# Patient Record
Sex: Male | Born: 1950 | Race: White | Hispanic: No | Marital: Married | Smoking: Never smoker
Health system: Southern US, Community
[De-identification: ages and names within clinical notes are randomized; demographics above are authoritative.]

## PROBLEM LIST (undated history)

## (undated) DIAGNOSIS — F319 Bipolar disorder, unspecified: Secondary | ICD-10-CM

---

## 2015-04-16 ENCOUNTER — Emergency Department (HOSPITAL_COMMUNITY)
Admission: EM | Admit: 2015-04-16 | Discharge: 2015-04-18 | Disposition: A | Discharge status: Home / Self Care | Payer: No Typology Code available for payment source | Attending: Emergency Medicine | Admitting: Emergency Medicine

## 2015-04-16 DIAGNOSIS — S299XXA Unspecified injury of thorax, initial encounter: Secondary | ICD-10-CM | POA: Diagnosis not present

## 2015-04-16 DIAGNOSIS — Y999 Unspecified external cause status: Secondary | ICD-10-CM | POA: Diagnosis not present

## 2015-04-16 DIAGNOSIS — S8992XA Unspecified injury of left lower leg, initial encounter: Secondary | ICD-10-CM | POA: Diagnosis present

## 2015-04-16 DIAGNOSIS — F3164 Bipolar disorder, current episode mixed, severe, with psychotic features: Secondary | ICD-10-CM

## 2015-04-16 DIAGNOSIS — S80212A Abrasion, left knee, initial encounter: Secondary | ICD-10-CM | POA: Diagnosis not present

## 2015-04-16 DIAGNOSIS — Y9389 Activity, other specified: Secondary | ICD-10-CM | POA: Insufficient documentation

## 2015-04-16 DIAGNOSIS — Y92410 Unspecified street and highway as the place of occurrence of the external cause: Secondary | ICD-10-CM | POA: Diagnosis not present

## 2015-04-16 DIAGNOSIS — S3991XA Unspecified injury of abdomen, initial encounter: Secondary | ICD-10-CM | POA: Insufficient documentation

## 2015-04-16 HISTORY — DX: Bipolar disorder, unspecified: F31.9
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
## 2015-04-17 ENCOUNTER — Encounter (HOSPITAL_COMMUNITY): Payer: Self-pay | Admitting: *Deleted

## 2015-04-17 ENCOUNTER — Emergency Department (HOSPITAL_COMMUNITY): Payer: No Typology Code available for payment source

## 2015-04-17 LAB — COMPREHENSIVE METABOLIC PANEL
ALK PHOS: 72 U/L (ref 38–126)
ALT: 38 U/L (ref 17–63)
ANION GAP: 8 (ref 5–15)
AST: 22 U/L (ref 15–41)
Albumin: 3.8 g/dL (ref 3.5–5.0)
BUN: 14 mg/dL (ref 6–20)
CALCIUM: 9.1 mg/dL (ref 8.9–10.3)
CHLORIDE: 104 mmol/L (ref 101–111)
CO2: 27 mmol/L (ref 22–32)
Creatinine, Ser: 1.33 mg/dL — ABNORMAL HIGH (ref 0.61–1.24)
GFR calc non Af Amer: 55 mL/min — ABNORMAL LOW (ref >60)
Glucose, Bld: 91 mg/dL (ref 65–99)
Potassium: 4.7 mmol/L (ref 3.5–5.1)
SODIUM: 139 mmol/L (ref 135–145)
Total Bilirubin: 0.8 mg/dL (ref 0.3–1.2)
Total Protein: 6.4 g/dL — ABNORMAL LOW (ref 6.5–8.1)

## 2015-04-17 LAB — CBC
HCT: 44.9 % (ref 39.0–52.0)
Hemoglobin: 15.4 g/dL (ref 13.0–17.0)
MCH: 29 pg (ref 26.0–34.0)
MCHC: 34.3 g/dL (ref 30.0–36.0)
MCV: 84.6 fL (ref 78.0–100.0)
Platelets: 224 10*3/uL (ref 150–400)
RBC: 5.31 MIL/uL (ref 4.22–5.81)
RDW: 13.6 % (ref 11.5–15.5)
WBC: 10.1 10*3/uL (ref 4.0–10.5)

## 2015-04-17 LAB — RAPID URINE DRUG SCREEN, HOSP PERFORMED
Amphetamines: NOT DETECTED
Barbiturates: NOT DETECTED
Benzodiazepines: NOT DETECTED
COCAINE: NOT DETECTED
OPIATES: NOT DETECTED
TETRAHYDROCANNABINOL: NOT DETECTED

## 2015-04-17 LAB — PROTIME-INR
INR: 1.06 (ref 0.00–1.49)
Prothrombin Time: 14 seconds (ref 11.6–15.2)

## 2015-04-17 LAB — ETHANOL

## 2015-04-17 MED ORDER — IBUPROFEN 400 MG PO TABS: 600.0000 mg | ORAL_TABLET | Freq: Three times a day (TID) | ORAL | Status: DC | PRN

## 2015-04-17 MED ORDER — LORAZEPAM 2 MG/ML IJ SOLN
2.0000 mg | Freq: Once | INTRAMUSCULAR | Status: AC
  Administered 2015-04-17: 2 mg via INTRAMUSCULAR
  Filled 2015-04-17: qty 1

## 2015-04-17 MED ORDER — ONDANSETRON HCL 4 MG PO TABS
4.0000 mg | ORAL_TABLET | Freq: Three times a day (TID) | ORAL | Status: DC | PRN
  Filled 2015-04-17: qty 1

## 2015-04-17 MED ORDER — ACETAMINOPHEN 325 MG PO TABS: 650.0000 mg | ORAL_TABLET | ORAL | Status: DC | PRN

## 2015-04-17 MED ORDER — HALOPERIDOL LACTATE 5 MG/ML IJ SOLN
5.0000 mg | Freq: Once | INTRAMUSCULAR | Status: AC
  Administered 2015-04-17: 5 mg via INTRAMUSCULAR
  Filled 2015-04-17: qty 1

## 2015-04-17 MED ORDER — OLANZAPINE 10 MG PO TBDP
10.0000 mg | ORAL_TABLET | Freq: Once | ORAL | Status: DC
  Filled 2015-04-17: qty 1

## 2015-04-17 MED ORDER — ZOLPIDEM TARTRATE 5 MG PO TABS: 5.0000 mg | ORAL_TABLET | Freq: Every evening | ORAL | Status: DC | PRN

## 2015-04-17 MED ORDER — ZIPRASIDONE MESYLATE 20 MG IM SOLR
20.0000 mg | Freq: Once | INTRAMUSCULAR | Status: AC
  Administered 2015-04-17: 20 mg via INTRAMUSCULAR

## 2015-04-17 NOTE — ED Notes (Signed)
Pt to be assessed by TTS when more alert; pt sleeping soundly

## 2015-04-17 NOTE — ED Notes (Signed)
IVC paperwork served by [employer]. 

## 2015-04-17 NOTE — ED Notes (Signed)
Wilhemina BonitoAdvised [place], [facility], of pt's psych dr in [place].

## 2015-04-17 NOTE — ED Notes (Signed)
Attempted to reach pt's spouse - Shelbie Proctorheresa Terhaar - [phone]- [phone] - wrong number. Left message on [phone] for her to call.
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
## 2015-04-17 NOTE — ED Notes (Signed)
Pt ambulated to desk requesting to speak with wife. Informed pt wife wasn't here. Pt assisted back to room, changed from gown to clothes then attempted to leave AMA through EMS entrance. Pt was stopped by Kathleen LimeEric and Morgan RN, security and [employer] with patient. Pt cooperative and assisted back to room.

## 2015-04-17 NOTE — BH Assessment (Signed)
[facility] Assessment Progress Note   Per [place], RN patient is still very sleepy from the geodon that was administered three hours ago.  TTS will be conducted when patient is able to be oriented and participate.

## 2015-04-17 NOTE — ED Notes (Addendum)
Pt noted to be yelled out loudly x 1. States feels manic.

## 2015-04-17 NOTE — ED Notes (Signed)
Pt yelling/moaning loudly. States is in manic state and is requesting med to help calm him down. Dr Adela LankFloyd aware - order received.

## 2015-04-17 NOTE — ED Notes (Signed)
Pt willing to accept geodon injection for agitation. Pt cooperative at this time, requesting to leave. [employer] and security at bedside. Pt placed in wine colored scrubs. Given something to drink per request. 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

## 2015-04-17 NOTE — ED Notes (Signed)
House coverage contacted for sitter. 
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
## 2015-04-17 NOTE — Progress Notes (Addendum)
[facility] to Dr. Elna BreslowEappen, 500-1, arrival time 12am. Call report at [phone]. [facility] RN Windy KalataYasemia informed.  This Clinical research associatewriter informed patient's wife, Mrs. Yetta BarreJones, that [facility] been [facility] and that he will be arriving tonight. Writer provided the phone [facility] and the [facility], at Mrs. Edward QualiaJone's request. Mrs. Yetta BarreJones thanked Clinical research associatewriter and stated that [facility].  Melbourne Abtsatia Yanett Conkright, LCSWA Disposition staff 04/17/2015 9:02 PM

## 2015-04-17 NOTE — Progress Notes (Signed)
[facility]. RN Windy KalataYasemia informed.  Peter Conrad, LCSWA Disposition staff 04/17/2015 11:02 PM
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
## 2015-04-17 NOTE — ED Notes (Signed)
Pt signed consent form for info to be released to spouse and Dr Tyrell AntonioBetz - [facility] and copy sent to medical records.

## 2015-04-17 NOTE — ED Notes (Signed)
Spoke to [place], pt's wife. [facility]. Informed wife of IVC status. Wife would like to be contacted after TTS can be completed

## 2015-04-17 NOTE — BH Assessment (Signed)
[facility] Assessment Progress Note  Pt's nurse, Becky called, stating pt awake and alert.  Tele assessment to be completed by this clinician.  Casimer LaniusKristen Shuntia Exton, MS, [facility] Therapeutic Triage Specialist [facility]

## 2015-04-17 NOTE — ED Notes (Signed)
Pt talking w/sitter - making inappropriate comments - stating "black people should go back to [place]".
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
## 2015-04-17 NOTE — ED Notes (Signed)
Notified by [facility] 12 am room 500[facility]1 by Dr. Joan MayansEattem. Dr. Adriana Simasook to be notified.

## 2015-04-17 NOTE — ED Notes (Signed)
TTS being performed.  

## 2015-04-17 NOTE — ED Notes (Signed)
Pt aware TTS to be performed then RN will administer meds. Voiced agreement and understanding.

## 2015-04-17 NOTE — ED Notes (Signed)
Patient was given a snack and drink. 

## 2015-04-18 ENCOUNTER — Encounter (HOSPITAL_COMMUNITY): Payer: Self-pay | Admitting: Psychiatry

## 2015-04-18 ENCOUNTER — Inpatient Hospital Stay (HOSPITAL_COMMUNITY)
Admission: AD | Admit: 2015-04-18 | Discharge: 2015-04-27 | DRG: 885 | Disposition: A | Discharge status: Home / Self Care | Payer: Federal, State, Local not specified - Other | Source: Intra-hospital | Attending: Psychiatry | Admitting: Psychiatry

## 2015-04-18 DIAGNOSIS — F39 Unspecified mood [affective] disorder: Secondary | ICD-10-CM | POA: Diagnosis present

## 2015-04-18 DIAGNOSIS — F1120 Opioid dependence, uncomplicated: Secondary | ICD-10-CM | POA: Diagnosis not present

## 2015-04-18 DIAGNOSIS — Z9114 Patient's other noncompliance with medication regimen: Secondary | ICD-10-CM

## 2015-04-18 DIAGNOSIS — F3164 Bipolar disorder, current episode mixed, severe, with psychotic features: Secondary | ICD-10-CM | POA: Diagnosis present

## 2015-04-18 DIAGNOSIS — F319 Bipolar disorder, unspecified: Secondary | ICD-10-CM | POA: Diagnosis present

## 2015-04-18 DIAGNOSIS — IMO0002 Reserved for concepts with insufficient information to code with codable children: Secondary | ICD-10-CM | POA: Diagnosis present

## 2015-04-18 DIAGNOSIS — F1220 Cannabis dependence, uncomplicated: Secondary | ICD-10-CM | POA: Diagnosis not present

## 2015-04-18 DIAGNOSIS — F1099 Alcohol use, unspecified with unspecified alcohol-induced disorder: Secondary | ICD-10-CM | POA: Diagnosis not present

## 2015-04-18 MED ORDER — HALOPERIDOL 5 MG PO TABS: 5.0000 mg | ORAL_TABLET | Freq: Four times a day (QID) | ORAL | Status: DC | PRN

## 2015-04-18 MED ORDER — CHLORDIAZEPOXIDE HCL 25 MG PO CAPS
25.0000 mg | ORAL_CAPSULE | Freq: Three times a day (TID) | ORAL | Status: AC
  Administered 2015-04-19 (×3): 25 mg via ORAL
  Filled 2015-04-18 (×3): qty 1

## 2015-04-18 MED ORDER — BENZTROPINE MESYLATE 0.5 MG PO TABS
0.5000 mg | ORAL_TABLET | Freq: Every day | ORAL | Status: DC
  Administered 2015-04-18 – 2015-04-26 (×9): 0.5 mg via ORAL
  Filled 2015-04-18 (×9): qty 1
  Filled 2015-04-18: qty 7
  Filled 2015-04-18: qty 1

## 2015-04-18 MED ORDER — ARIPIPRAZOLE 5 MG PO TABS
5.0000 mg | ORAL_TABLET | Freq: Every day | ORAL | Status: DC
  Administered 2015-04-18: 5 mg via ORAL
  Filled 2015-04-18 (×3): qty 1

## 2015-04-18 MED ORDER — ADULT MULTIVITAMIN W/MINERALS CH
1.0000 | ORAL_TABLET | Freq: Every day | ORAL | Status: DC
Start: 2015-04-18 — End: 2015-04-27
  Administered 2015-04-18 – 2015-04-27 (×10): 1 via ORAL
  Filled 2015-04-18 (×13): qty 1
  Filled 2015-04-18: qty 7

## 2015-04-18 MED ORDER — ALUM & MAG HYDROXIDE-SIMETH 200-200-20 MG/5ML PO SUSP
30.0000 mL | ORAL | Status: DC | PRN
  Administered 2015-04-20: 30 mL via ORAL
  Filled 2015-04-18: qty 30

## 2015-04-18 MED ORDER — HYDROXYZINE HCL 25 MG PO TABS
25.0000 mg | ORAL_TABLET | Freq: Four times a day (QID) | ORAL | Status: AC | PRN
  Administered 2015-04-18 – 2015-04-20 (×3): 25 mg via ORAL
  Filled 2015-04-18 (×3): qty 1

## 2015-04-18 MED ORDER — CHLORDIAZEPOXIDE HCL 25 MG PO CAPS
25.0000 mg | ORAL_CAPSULE | ORAL | Status: AC
  Administered 2015-04-20 (×2): 25 mg via ORAL
  Filled 2015-04-18 (×2): qty 1

## 2015-04-18 MED ORDER — ONDANSETRON 4 MG PO TBDP: 4.0000 mg | ORAL_TABLET | Freq: Four times a day (QID) | ORAL | Status: AC | PRN

## 2015-04-18 MED ORDER — OLANZAPINE 2.5 MG PO TABS
ORAL_TABLET | ORAL | Status: AC
  Filled 2015-04-18: qty 1

## 2015-04-18 MED ORDER — CHLORDIAZEPOXIDE HCL 25 MG PO CAPS
25.0000 mg | ORAL_CAPSULE | Freq: Every day | ORAL | Status: AC
  Administered 2015-04-21: 25 mg via ORAL
  Filled 2015-04-18: qty 1

## 2015-04-18 MED ORDER — LOPERAMIDE HCL 2 MG PO CAPS: 2.0000 mg | ORAL_CAPSULE | ORAL | Status: AC | PRN

## 2015-04-18 MED ORDER — BENZTROPINE MESYLATE 1 MG PO TABS: 1.0000 mg | ORAL_TABLET | Freq: Four times a day (QID) | ORAL | Status: DC | PRN

## 2015-04-18 MED ORDER — THIAMINE HCL 100 MG/ML IJ SOLN
100.0000 mg | Freq: Once | INTRAMUSCULAR | Status: DC
  Filled 2015-04-18: qty 2

## 2015-04-18 MED ORDER — MAGNESIUM HYDROXIDE 400 MG/5ML PO SUSP: 30.0000 mL | Freq: Every day | ORAL | Status: DC | PRN

## 2015-04-18 MED ORDER — OLANZAPINE 5 MG PO TBDP
ORAL_TABLET | ORAL | Status: AC
  Filled 2015-04-18: qty 1

## 2015-04-18 MED ORDER — ZIPRASIDONE HCL 20 MG PO CAPS
20.0000 mg | ORAL_CAPSULE | Freq: Four times a day (QID) | ORAL | Status: DC | PRN
  Administered 2015-04-18 – 2015-04-20 (×2): 20 mg via ORAL
  Filled 2015-04-18 (×2): qty 1

## 2015-04-18 MED ORDER — DIVALPROEX SODIUM ER 500 MG PO TB24
500.0000 mg | ORAL_TABLET | Freq: Every day | ORAL | Status: DC
  Administered 2015-04-18: 500 mg via ORAL
  Filled 2015-04-18 (×3): qty 1

## 2015-04-18 MED ORDER — ACETAMINOPHEN 325 MG PO TABS
650.0000 mg | ORAL_TABLET | Freq: Four times a day (QID) | ORAL | Status: DC | PRN
  Administered 2015-04-19 – 2015-04-26 (×5): 650 mg via ORAL
  Filled 2015-04-18 (×5): qty 2

## 2015-04-18 MED ORDER — ZIPRASIDONE MESYLATE 20 MG IM SOLR
20.0000 mg | Freq: Four times a day (QID) | INTRAMUSCULAR | Status: DC | PRN
Start: 2015-04-18 — End: 2015-04-27
  Filled 2015-04-18: qty 20

## 2015-04-18 MED ORDER — VITAMIN B-1 100 MG PO TABS
100.0000 mg | ORAL_TABLET | Freq: Every day | ORAL | Status: DC
  Administered 2015-04-19 – 2015-04-27 (×9): 100 mg via ORAL
  Filled 2015-04-18 (×11): qty 1

## 2015-04-18 MED ORDER — CHLORDIAZEPOXIDE HCL 25 MG PO CAPS
25.0000 mg | ORAL_CAPSULE | Freq: Four times a day (QID) | ORAL | Status: DC | PRN
  Administered 2015-04-19: 25 mg via ORAL
  Filled 2015-04-18: qty 1

## 2015-04-18 MED ORDER — OLANZAPINE 5 MG PO TABS
5.0000 mg | ORAL_TABLET | Freq: Four times a day (QID) | ORAL | Status: DC | PRN
Start: 2015-04-18 — End: 2015-04-18
  Administered 2015-04-18: 5 mg via ORAL

## 2015-04-18 MED ORDER — CHLORDIAZEPOXIDE HCL 25 MG PO CAPS
25.0000 mg | ORAL_CAPSULE | Freq: Four times a day (QID) | ORAL | Status: AC
  Administered 2015-04-18 (×3): 25 mg via ORAL
  Filled 2015-04-18 (×3): qty 1
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
## 2015-04-18 NOTE — Progress Notes (Signed)
1:1 note:  Pt actively hallucinating; auditory and visual hallucinations reported by assigned MHT. Increase aggiation/aggression noted MHT reports pt verbally aggressive "I'm going to hit you" and making hand gestures. Pt observed on the unit with verbal aggression towards peers. Difficult to redirect. No insight in regards to behavior. PRN medication given for agitation. 1:1 remains for safety.

## 2015-04-18 NOTE — Progress Notes (Signed)
1:1 note:  Pt presents sitting in his assigned bed eating his dinner. Pt voices no complaints at this time. Behavior calm at this time. Pt presents with increased confusion, delusional. Pt believes he is [employer]. MHT at bedside. Flight of ideas noted. Remains on 1:1 for safety.

## 2015-04-18 NOTE — [facility] Group Notes (Signed)
[facility] LCSW Group Therapy  04/18/2015 1:20 PM  Type of Therapy: Group Therapy  Participation Level:Invited. Chose not to attend.  Summary of Progress/Problems: Onalee HuaDavid from [employer] was here to tell his story of recovery and play his guitar.  Vito BackersLynn B. Beverely PaceBryant 04/18/2015 1:20 PM

## 2015-04-18 NOTE — Progress Notes (Signed)
Adult Psychoeducational Group Note  Date:  04/18/2015 Time:  9:04 PM  Group Topic/Focus:  Wrap-Up Group:   The focus of this group is to help patients review their daily goal of treatment and discuss progress on daily workbooks.  Participation Level:  Did Not Attend  Participation Quality:  Did not attend  Affect:  Did not attend  Cognitive:  Did not attend  Insight: None  Engagement in Group:  Did not attend  Modes of Intervention:  Did not attend  Additional Comments:  Patient did not attend wrap up group tonight.   Lilit Cinelli L Raylyn Speckman 04/18/2015, 9:04 PM

## 2015-04-18 NOTE — ED Notes (Signed)
MD at the bedside  
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
## 2015-04-18 NOTE — ED Notes (Signed)
Attempted Report x1.   

## 2015-04-18 NOTE — Tx Team (Signed)
Initial Interdisciplinary Treatment Plan   PATIENT STRESSORS: Medication change or noncompliance Substance abuse   PATIENT STRENGTHS: Ability for insight   PROBLEM LIST: Problem List/Patient Goals Date to be addressed Date deferred Reason deferred Estimated date of resolution  Psychosis 04/18/2015     Medication non compliance 04/18/2015     "Not sleeping good" 04/18/2015     "I got tired of them, sometimes make me feel down" 04/18/2015     "All my life with mental health."  04/18/2015                              DISCHARGE CRITERIA:  Ability to meet basic life and health needs Improved stabilization in mood, thinking, and/or behavior  PRELIMINARY DISCHARGE PLAN: Return to previous living arrangement  PATIENT/FAMIILY INVOLVEMENT: This treatment plan has been presented to and reviewed with the patient, Peter Conrad, Peter Conrad 04/18/2015, 2:56 PM
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
## 2015-04-18 NOTE — Progress Notes (Signed)
Pt is weak and unsteady on his feet   He is confused and has difficulty processing information   Pt is on a 1:1 for safety  Safety maintained

## 2015-04-18 NOTE — Progress Notes (Signed)
Patient lacks capacity to participate in disposition planning.   Peter LongsSaramma Aeris Conrad ,MD [facility]
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
## 2015-04-19 LAB — URINALYSIS W MICROSCOPIC (NOT AT ARMC)
GLUCOSE, UA: NEGATIVE mg/dL
Ketones, ur: NEGATIVE mg/dL
Leukocytes, UA: NEGATIVE
NITRITE: NEGATIVE
PROTEIN: NEGATIVE mg/dL
SPECIFIC GRAVITY, URINE: 1.028 (ref 1.005–1.030)
UROBILINOGEN UA: 0.2 mg/dL (ref 0.0–1.0)
pH: 5.5 (ref 5.0–8.0)

## 2015-04-19 LAB — LIPID PANEL
CHOL/HDL RATIO: 3 ratio
Cholesterol: 190 mg/dL (ref 0–200)
HDL: 63 mg/dL (ref >40)
LDL Cholesterol: 103 mg/dL — ABNORMAL HIGH (ref 0–99)
TRIGLYCERIDES: 121 mg/dL (ref <150)
VLDL: 24 mg/dL (ref 0–40)

## 2015-04-19 LAB — TSH: TSH: 0.881 u[IU]/mL (ref 0.350–4.500)

## 2015-04-19 MED ORDER — DIVALPROEX SODIUM ER 250 MG PO TB24
750.0000 mg | ORAL_TABLET | Freq: Every day | ORAL | Status: DC
  Administered 2015-04-19 – 2015-04-26 (×8): 750 mg via ORAL
  Filled 2015-04-19 (×2): qty 3
  Filled 2015-04-19: qty 21
  Filled 2015-04-19 (×7): qty 3

## 2015-04-19 MED ORDER — ARIPIPRAZOLE 10 MG PO TABS
10.0000 mg | ORAL_TABLET | Freq: Every day | ORAL | Status: DC
  Administered 2015-04-19: 10 mg via ORAL
  Filled 2015-04-19 (×2): qty 1

## 2015-04-19 NOTE — [facility] Group Notes (Signed)
[facility] Group Notes:  (Nursing/MHT/Case Management/Adjunct)  Date:  04/19/2015  Time: 0930 Type of Therapy:  Nurse Education  Participation Level:  Did Not Attend  Summary of Progress/Problems:  Peter Conrad 04/19/2015, 1:25 PM

## 2015-04-19 NOTE — Progress Notes (Signed)
Pt remains stiff and has an unsteady gait he is weak and tremulous   Pt is on a 1:1 for safety   Safety maintained
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
## 2015-04-19 NOTE — Plan of Care (Signed)
Problem: Ineffective individual coping Goal: STG: Patient will remain free from self harm Outcome: Progressing Pt has remained free from self harm     
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
## 2015-04-19 NOTE — [facility] Group Notes (Signed)
[facility] LCSW Group Therapy  04/19/2015 1:15 pm  Type of Therapy: Process Group Therapy  Participation Level:  Active  Participation Quality:  Appropriate  Affect:  Flat  Cognitive:  Oriented  Insight:  Improving  Engagement in Group:  Limited  Engagement in Therapy:  Limited  Modes of Intervention:  Activity, Clarification, Education, Problem-solving and Support  Summary of Progress/Problems: Today's group addressed the issue of overcoming obstacles.  Patients were asked to identify their biggest obstacle post d/c that stands in the way of their on-going success, and then problem solve as to how to manage this.  Sat quietly throughout.  Left at one point, but returned.  At the end, talked about how he enjoyed hearing other people's stories.  "It's good to know I am not the only one going through turmoil."  Daryel Geraldorth, Jacobi Nile B 04/19/2015   1:42 PM

## 2015-04-19 NOTE — Progress Notes (Signed)
1:1 note:  Pt completed self inventory form with assigned MHT assist.Per patient self inventory form pt reports he slept good last night with the use of sleep medication. He reports a good appetite, normal energy level, good concentration. He rates depression 7/10, hopelessness 0/10, anxiety 0/10- all on 0-10 scale, 10 being the worse. Pt denies SI/HI. Delusional. Pt denies physical pain. Pt reports his goal for the day is "get well" and he will "talk to people get out and mingle" to help meet his goal today. "you got a fine staff here today" Pt OOB active in the milieu.  Confusion still noted. Pt voices no complaints. Remains on 1:1 for safety. Will continue to monitor.

## 2015-04-19 NOTE — Progress Notes (Deleted)
Patient ID: Lowanda FosterBeverly Csaszar, male   DOB: Jul 18, 1950, 64 y.o.   MRN: [id number] PER STATE REGULATIONS 482.30  THIS CHART WAS REVIEWED FOR MEDICAL NECESSITY WITH RESPECT TO THE PATIENT'S ADMISSION/DURATION OF STAY.  NEXT REVIEW DATE:04/22/15  Loura HaltBARBARA Navreet Bolda, RN, BSN CASE MANAGER

## 2015-04-19 NOTE — Progress Notes (Signed)
1:1 note:  Pt pleasant on approach. Denies s/s of withdrawal at this time. Compliant with medication regimen. Pt disoriented to place at this time. Easily redirected. OOB throughout the shift. Pt voices no complaints at this time. Assigned MHT present. Gait slow, but steady. Will continue to monitor on 1:1 for safety

## 2015-04-19 NOTE — Progress Notes (Signed)
Pt woke up briefly and took his medications   He seemed a little clearer   He denied withdrawal symptoms   He continues to be weak and shakey and needs assistance to sit up in bed to take his medications    Pt is on a 1:1 for safety   Safety maintained

## 2015-04-19 NOTE — Progress Notes (Signed)
Pt did not attend karaoke this evening.  

## 2015-04-19 NOTE — Progress Notes (Signed)
1:1 note 0800:  Pt presents sitting in his bed eating his breakfast. Behavior calm. Pleasant on approach. Increase confusion noted. Delusional. Pt denies SI/HI. Pt reports he had a good night. No complaints at this time. Assigned MHT at bedside. Will continue to monitor on 1:1 for safety.
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
## 2015-04-20 LAB — PROLACTIN: Prolactin: 14.9 ng/mL (ref 4.0–15.2)

## 2015-04-20 LAB — HEMOGLOBIN A1C
Hgb A1c MFr Bld: 5.6 % (ref 4.8–5.6)
Mean Plasma Glucose: 114 mg/dL

## 2015-04-20 MED ORDER — TRAZODONE HCL 100 MG PO TABS
100.0000 mg | ORAL_TABLET | Freq: Every day | ORAL | Status: DC
  Administered 2015-04-20 – 2015-04-22 (×3): 100 mg via ORAL
  Filled 2015-04-20 (×5): qty 1

## 2015-04-20 MED ORDER — ZIPRASIDONE MESYLATE 20 MG IM SOLR
20.0000 mg | Freq: Once | INTRAMUSCULAR | Status: DC
  Filled 2015-04-20: qty 20

## 2015-04-20 MED ORDER — ARIPIPRAZOLE 15 MG PO TABS
15.0000 mg | ORAL_TABLET | Freq: Every day | ORAL | Status: DC
  Administered 2015-04-20 – 2015-04-22 (×3): 15 mg via ORAL
  Filled 2015-04-20 (×5): qty 1

## 2015-04-20 MED ORDER — DIPHENHYDRAMINE HCL 50 MG/ML IJ SOLN
INTRAMUSCULAR | Status: AC
  Filled 2015-04-20: qty 1

## 2015-04-20 MED ORDER — DIPHENHYDRAMINE HCL 50 MG/ML IJ SOLN
50.0000 mg | Freq: Once | INTRAMUSCULAR | Status: DC
  Filled 2015-04-20: qty 1
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
## 2015-04-20 NOTE — Progress Notes (Signed)
D:Pt is in the dayroom watching TV. Pt continues to be tangential with speech. A:Offered support and 1:1 observation. R:Safety maintained on the unit.
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
## 2015-04-20 NOTE — Progress Notes (Signed)
  D: When asked about his day pt stated, "going very well today. Medications are working".  Pt informed the writer that he was "hoping to get to visit and have fun with his sisters and brothers". Writer asked pt if he meant his peers or was he referring to his real sisters and brothers. Pt stated he didn't have any peers. Writer informed pt that visitation was over at 2000 and that maybe he could have his siblings come tomorrow. Stated, "I was hoping to go to karaoke to have a good time with everyone else". Informed the pt that on a 1:1 he would not be able to go to any activities off the unit. Pt asked why he was placed on a 1:1. Writer explained that it was due to his behavior. Pt stated he's been good all day. Pt encouraged writer to speak with his dr in the am in hopes of discontinuing the 1:1. Pt voiced no other questions or concerns at this time.   A:  Support and encouragement was offered. Continue to monitor 1:1 for safety.  R: Pt remains safe.
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
## 2015-04-20 NOTE — Progress Notes (Signed)
B[facility] Post 1:1 Observation Documentation  For the first (8) hours following discontinuation of 1:1 precautions, a progress note entry by nursing staff should be documented at least every 2 hours, reflecting the patient's behavior, condition, mood, and conversation.  Use the progress notes for additional entries.  Time 1:1 discontinued:  2238  Patient's Behavior:  Laying in bed resting with eyes closed.   Patient's Condition:  No distress noted  Patient's Conversation:  none  Doristine JohnsBrooks, Shalice Woodring Laverne 04/20/2015, 5:46 AM

## 2015-04-20 NOTE — Progress Notes (Signed)
[facility] Post 1:1 Observation Documentation  For the first (8) hours following discontinuation of 1:1 precautions, a progress note entry by nursing staff should be documented at least every 2 hours, reflecting the patient's behavior, condition, mood, and conversation.  Use the progress notes for additional entries.  Time 1:1 discontinued:  2238  Patient's Behavior:  Pacing the hall. Knocked on peer's door to get her to come to his room.   Patient's Condition:  Alert  Patient's Conversation:  Pt was informed that he couldn't enter another pt's room. Stated he wasn't going to go into the room, but was knocking. Pt complied and was given prn geodon.   Peter Conrad, Peter Conrad 04/20/2015, 5:38 AM

## 2015-04-20 NOTE — Plan of Care (Signed)
Problem: Ineffective individual coping Goal: STG: Pt will be able to identify effective and ineffective STG: Pt will be able to identify effective and ineffective coping patterns  Outcome: Progressing Pt is manic and unable to process at this time.

## 2015-04-20 NOTE — Progress Notes (Signed)
B[facility] Post 1:1 Observation Documentation  For the first (8) hours following discontinuation of 1:1 precautions, a progress note entry by nursing staff should be documented at least every 2 hours, reflecting the patient's behavior, condition, mood, and conversation.  Use the progress notes for additional entries.  Time 1:1 discontinued:  2238  Patient's Behavior:  Pt laying in bed resting with eyes closed.  Patient's Condition:  No distress noted   Patient's Conversation:  none  Doristine JohnsBrooks, Madix Blowe Laverne 04/20/2015, 6:11 AM

## 2015-04-20 NOTE — B[facility] Group Notes (Signed)
[facility] LCSW Group Therapy  04/20/2015  1:05 PM  Type of Therapy:  Group therapy  Participation Level:  Active  Participation Quality:  Attentive  Affect:  Flat  Cognitive:  Oriented  Insight:  Limited  Engagement in Therapy:  Limited  Modes of Intervention:  Discussion, Socialization  Summary of Progress/Problems:  Chaplain was here to lead a group on themes of hope and courage. "Courage is the ability to stand up against all odds.  Whenever I think of my brother, I think of what I lost."  Began to cry.  Talked about the process of settling brothers estate. A bit tangential.  "I'm finding courage to live day to day, to get stronger every day."  Drowzy. Limited insight. Peter Conrad, Peter Conrad 04/20/2015 1:42 PM
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
## 2015-04-20 NOTE — Progress Notes (Signed)
[facility] Post 1:1 Observation Documentation  For the first (8) hours following discontinuation of 1:1 precautions, a progress note entry by nursing staff should be documented at least every 2 hours, reflecting the patient's behavior, condition, mood, and conversation.  Use the progress notes for additional entries.  Time 1:1 discontinued:  2238  Patient's Behavior:  Bed resting with eyes closed.     Patient's Condition:  No distress noted.   Patient's Conversation:  none  Doristine JohnsBrooks, Bibiana Gillean Laverne 04/20/2015, 5:53 AM
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
## 2015-04-21 DIAGNOSIS — F3164 Bipolar disorder, current episode mixed, severe, with psychotic features: Principal | ICD-10-CM

## 2015-04-21 MED ORDER — CHLORDIAZEPOXIDE HCL 25 MG PO CAPS
50.0000 mg | ORAL_CAPSULE | Freq: Three times a day (TID) | ORAL | Status: DC | PRN
  Administered 2015-04-22 – 2015-04-26 (×2): 50 mg via ORAL
  Filled 2015-04-21 (×2): qty 2

## 2015-04-21 MED ORDER — HYDROCORTISONE 1 % EX CREA
TOPICAL_CREAM | Freq: Two times a day (BID) | CUTANEOUS | Status: DC
  Administered 2015-04-21 – 2015-04-22 (×2): via TOPICAL
  Administered 2015-04-23: 1 via TOPICAL
  Administered 2015-04-23 – 2015-04-24 (×2): via TOPICAL
  Administered 2015-04-27: 1 via TOPICAL
  Filled 2015-04-21: qty 28

## 2015-04-21 NOTE — Progress Notes (Addendum)
1:1 Note   Patient observed in dayroom watching television. Patient calm and cooperative. Patient did engage in conversation with this Clinical research associatewriter states his name is "Horticulturist, commercialBill Conrad". Patient continued to communicate he had a good day and he "fells things are getting better since he is on his medication." " My head is clearer now." Patient maintained on constant supervision for safety. 1:1 MHT at arms length to patient.  Patient with no concerns reported. Encouraged patient to express thoughts and feelings. Monitoring continues.

## 2015-04-21 NOTE — Progress Notes (Signed)
1:1 Note: Patient maintained on constant supervision for safety.  Patient in the dayroom having lunch.  No issues reported or observed.  Offered no complaint.  Patient affect and mood is calm.

## 2015-04-21 NOTE — Progress Notes (Signed)
Observation note: Patient up and in the dayroom.  Mood and affect is calm and pleasant.  Patient continues on constant supervision for safety.  Patient in the dayroom watching TV.  No behavioral issues noted or reported.
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
## 2015-04-21 NOTE — Progress Notes (Signed)
1:1 Note: Patient maintained on constant observation for safety. Patient rates depression, hopelessness, and anxiety at 0/10.  States goal for today is "getting home."  Support and encouragement offered as needed.  Attended group therapy and participated.

## 2015-04-21 NOTE — Progress Notes (Signed)
   D:Pt laying in bed resting with eyes closed. Respirations even and unlabored. No distress noted. A: Monitor 1:1 for safety. R: Pt remains safe.   

## 2015-04-21 NOTE — Progress Notes (Signed)
   D:  Pt informed the writer that he "wants to be out of her by Sunday". Stated, "I want to go to  homecoming and [employer] service. There's something special where the dead rises".  Pt asked the writer if she was aware of the even occuring this Sunday. Pt informed the writer that she wasn't aware. Pt voiced no other questions or concerns.   A:  Support and encouragement was offered. 15 min checks continued for safety.  R: Pt remains safe.

## 2015-04-22 MED ORDER — NAPHAZOLINE-PHENIRAMINE 0.025-0.3 % OP SOLN
1.0000 [drp] | Freq: Four times a day (QID) | OPHTHALMIC | Status: DC | PRN
  Administered 2015-04-22: 1 [drp] via OPHTHALMIC
  Filled 2015-04-22: qty 15

## 2015-04-22 MED ORDER — NAPHAZOLINE HCL 0.1 % OP SOLN: 1.0000 [drp] | Freq: Four times a day (QID) | OPHTHALMIC | Status: DC | PRN

## 2015-04-22 NOTE — Progress Notes (Signed)
[facility] Post 1:1 Observation Documentation  For the first (8) hours following discontinuation of 1:1 precautions, a progress note entry by nursing staff should be documented at least every 2 hours, reflecting the patient's behavior, condition, mood, and conversation.  Use the progress notes for additional entries.  Time 1:1 discontinued:  1220  Patient's Behavior:calm and cooperative    Patient's Condition:  Pt is steady on his feet  Patient's Conversation:  Mild confusion and tangential  Andrena Mewsuttall, Lafreda Casebeer J 04/22/2015, 9:50 PM

## 2015-04-22 NOTE — Progress Notes (Signed)
Pt agitated and anxious at this time. Pt paranoid and became verbally aggressive. Pt given prn librium for anxiety. Pt refused to attend supper in the cafeteria this evening. Pt given supper in the dayroom.

## 2015-04-22 NOTE — Progress Notes (Signed)
1:1 Note  Patient observed in room sitting on bed reading a book. Patient maintained with continuous supervision for safety. 1:1 MHT at arms length. Monitoring continues.

## 2015-04-22 NOTE — Progress Notes (Signed)
[facility] Post 1:1 Observation Documentation  For the first (8) hours following discontinuation of 1:1 precautions, a progress note entry by nursing staff should be documented at least every 2 hours, reflecting the patient's behavior, condition, mood, and conversation.  Use the progress notes for additional entries.  Time 1:1 discontinued:  1220  Patient's Behavior:  Calm and cooperative  Patient's Condition:  Safety maintained. Pt sitting in dayroom eating lunch.   Patient's Conversation:  Speech tangentialCliffton Conrad.  Peter Conrad 04/22/2015, 12:21 PM

## 2015-04-22 NOTE — [facility] Group Notes (Signed)
[facility] Group Notes:  (Clinical Social Work)  04/22/2015  [facility] Group Notes:  (Clinical Social Work)  04/22/2015  11:00AM-12:00PM  Summary of Progress/Problems:  The main focus of today's process group was to listen to a variety of genres of music and to identify that different types of music provoke different responses.  The patient then was able to identify personally what was soothing for them, as well as energizing.  The patient expressed understanding of concepts, as well as knowledge of how each type of music affected him and how this can be used at home as a wellness/recovery tool.  He cried during several songs, got up and danced and generally really was interactive with the music.  Type of Therapy:  Music Therapy   Participation Level:  Active  Participation Quality:  Attentive and Sharing  Affect:  Blunted and Tearful  Cognitive:  Oriented  Insight:  Engaged  Engagement in Therapy:  Engaged  Modes of Intervention:   Activity, Exploration  Ambrose MantleMareida Grossman-Orr, LCSW 04/22/2015
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
## 2015-04-22 NOTE — Progress Notes (Signed)
[facility] Post 1:1 Observation Documentation  For the first (8) hours following discontinuation of 1:1 precautions, a progress note entry by nursing staff should be documented at least every 2 hours, reflecting the patient's behavior, condition, mood, and conversation.  Use the progress notes for additional entries.  Time 1:1 discontinued:  1220  Patient's Behavior:  Pt observed lying in bed asleep. Pt do not appear to be in distress. Pt resp are even and unlabored.  Patient's Condition:  Pt safety maintained. 15 minute checks performed for safety.  Patient's Conversation:  Peter Ehlersn/a  Peter Conrad L 04/22/2015, 4:25 PM

## 2015-04-22 NOTE — Progress Notes (Signed)
Observation note: Pt presents calm and cooperative this morning while taking scheduled medications. Pt stated that he do not want to take any meds that will cause him to be drowsy. Writer reviewed meds with pt and pt verbalized understanding and agreed to take meds. Pt denies AVH and stated he last endorsed AVH two days ago. Pt speech tangential and pt thoughts are disorganized occasionally. Pt denies suicidal thoughts. Pt reported that he slept well last night. Pt remains on 1:1 observation for safety. Pt safety maintained.

## 2015-04-22 NOTE — Progress Notes (Signed)
1:1 Note  Patient observed in bed sleeping with 1:1 MHT at arms length. No distress noted. Patient maintained on constant supervision for safety. Monitoring continues. 

## 2015-04-22 NOTE — Plan of Care (Signed)
Problem: Alteration in mood & ability to function due to Goal: LTG-Pt verbalizes understanding of importance of med regimen (Patient verbalizes understanding of importance of medication regimen and need to continue outpatient care and support groups)  Outcome: Progressing Patient stated "I feel I am better my head and thoughts are clearer, I believe the medication is working. Patient took medications without difficulty. Patient educated on medication reinforcement need questionable how much patient understood. Monitoring continues.

## 2015-04-22 NOTE — Progress Notes (Signed)
Adult Psychoeducational Group Note  Date:  04/22/2015 Time:  8:34 PM  Group Topic/Focus:  Wrap-Up Group:   The focus of this group is to help patients review their daily goal of treatment and discuss progress on daily workbooks.  Participation Level:  Active  Participation Quality:  Appropriate  Affect:  Appropriate  Cognitive:  Appropriate  Insight: Appropriate  Engagement in Group:  Engaged  Modes of Intervention:  Discussion  Additional Comments: The patient expressed that he attended group.The patient also said that he had a good day and rates it a 9.  [name], Duriel Deery Lee 04/22/2015, 8:34 PM

## 2015-04-22 NOTE — Progress Notes (Signed)
[facility] Post 1:1 Observation Documentation  For the first (8) hours following discontinuation of 1:1 precautions, a progress note entry by nursing staff should be documented at least every 2 hours, reflecting the patient's behavior, condition, mood, and conversation.  Use the progress notes for additional entries.  Time 1:1 discontinued:  1220  Patient's Behavior:  Pt calm and cooperative at this time. Pt observed sitting in dayroom, watching television.  Patient's Condition:  Pt safety maintained at this time. 15 minute checks performed for safety.   Patient's Conversation:  Speech tangentialCliffton Asters.  Satvik Parco L 04/22/2015, 6:31 PM

## 2015-04-22 NOTE — Progress Notes (Signed)
[facility] Post 1:1 Observation Documentation  For the first (8) hours following discontinuation of 1:1 precautions, a progress note entry by nursing staff should be documented at least every 2 hours, reflecting the patient's behavior, condition, mood, and conversation.  Use the progress notes for additional entries.  Time 1:1 discontinued:  1220  Patient's Behavior:  Pt calm and cooperative.  Patient's Condition:  Pt safety maintained at this time.   Patient's Conversation:  Tangential speech.   Lynzi Meulemans L 04/22/2015, 3:02 PM

## 2015-04-22 NOTE — Progress Notes (Signed)
D   Pt was observed in the dayroom watching tv and talking on the phone   He is more steady on his feet but remains tangential and mildly confused when talking to him   He did not make any delusional statements   His interactions with others is minimal but appropriate   He denies any withdrawal symptoms A   Verbal support given   Medications administered and effectiveness monitored    Continue to evaluate confusion and withdrawal symptoms    Q 15 min checks R    Pt safe at present and receptive to verbal support

## 2015-04-23 LAB — VALPROIC ACID LEVEL: Valproic Acid Lvl: 51 ug/mL (ref 50.0–100.0)

## 2015-04-23 MED ORDER — ARIPIPRAZOLE 10 MG PO TABS
20.0000 mg | ORAL_TABLET | Freq: Every day | ORAL | Status: DC
  Administered 2015-04-23 – 2015-04-25 (×3): 20 mg via ORAL
  Filled 2015-04-23 (×5): qty 2

## 2015-04-23 MED ORDER — TRAZODONE HCL 50 MG PO TABS
ORAL_TABLET | ORAL | Status: AC
  Filled 2015-04-23: qty 1

## 2015-04-23 MED ORDER — TRAZODONE HCL 100 MG PO TABS
125.0000 mg | ORAL_TABLET | Freq: Every day | ORAL | Status: DC
  Administered 2015-04-23: 125 mg via ORAL
  Filled 2015-04-23 (×2): qty 1
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
## 2015-04-23 NOTE — [facility] Group Notes (Signed)
[facility] Group Notes:  (Counselor/Nursing/MHT/Case Management/Adjunct)  04/23/2015 1:15PM  Type of Therapy:  Group Therapy  Participation Level:  Active  Participation Quality:  Appropriate  Affect:  Flat  Cognitive:  Oriented  Insight:  Improving  Engagement in Group:  Limited  Engagement in Therapy:  Limited  Modes of Intervention:  Discussion, Exploration and Socialization  Summary of Progress/Problems: The topic for group was balance in life.  Pt participated in the discussion about when their life was in balance and out of balance and how this feels.  Pt discussed ways to get back in balance and short term goals they can work on to get where they want to be. Stayed the entire time.  Was generally engaged.  Nodded off a few times, but not for long.  Talked about the death of his brother again.  "Although it happened in '13, it is still affecting me.  I haven't been the same since."  Talked about his hopes of getting through this "and getting back on the golf course again.  It's good being with friends, and forgetting about everything else for awhile."   Ida Rogueorth, Haeli Gerlich B 04/23/2015 3:15 PM

## 2015-04-23 NOTE — [facility] Group Notes (Signed)
[facility]   04/23/2015 10:47 AM  Participation Quality:  Engaged  Mood/Affect:  Flat  Depression Rating:    Anxiety Rating:    Thoughts of Suicide:  No Will you contract for safety?   NA  Current AVH:  No  Plan for Discharge/Comments:  Peter Conrad is in a good mood today.  States that he is happy with the change in medication.  Despite c/o sedation last week, states that he realizes he was "wide open" before coming in and it created problems.  Looking forward to getting back to playing golf 3 times a week with his buddies. When asked about his clothes, states he deos not need clothes-likes hospital issued garb.  "it's comfortable, it breathes."  Transportation Means:   Supports:  Peter Conrad, Peter Conrad

## 2015-04-23 NOTE — Plan of Care (Signed)
Problem: Alteration in mood & ability to function due to Goal: LTG-Pt reports reduction in suicidal thoughts (Patient reports reduction in suicidal thoughts and is able to verbalize a safety plan for whenever patient is feeling suicidal)  Outcome: Progressing Pt denies suicidal ideation at present Goal: LTG-Patient demonstrates decreased signs of withdrawal (Patient demonstrates decreased signs of withdrawal to the point the patient is safe to return home and continue treatment in an outpatient setting)  Outcome: Progressing Pt denies withdrawal symptoms and his ciwas have been low Goal: STG-Patient will attend groups Outcome: Progressing Pt attends most of his groups Goal: STG-Patient will comply with prescribed medication regimen (Patient will comply with prescribed medication regimen)  Pt takes his medications regularly

## 2015-04-23 NOTE — Progress Notes (Signed)
DAR NOTE: Patient presents with calm mood and affect.  Denies pain, auditory and visual hallucinations.  Rates depression , hopelessness, and anxiety at 0/10.  Maintained on routine safety checks.  Medications given as prescribed.  Support and encouragement offered as needed.  Attended group and participated.  States goal for today is "getting well and contacting family."  Patient observed socializing with peers in the dayroom.

## 2015-04-24 MED ORDER — INFLUENZA VAC SPLIT QUAD 0.5 ML IM SUSY
0.5000 mL | PREFILLED_SYRINGE | INTRAMUSCULAR | Status: AC
  Administered 2015-04-25: 0.5 mL via INTRAMUSCULAR
  Filled 2015-04-24: qty 0.5

## 2015-04-24 MED ORDER — ARIPIPRAZOLE ER 400 MG IM SUSR
400.0000 mg | INTRAMUSCULAR | Status: DC
  Filled 2015-04-24: qty 400

## 2015-04-24 MED ORDER — ARIPIPRAZOLE ER 400 MG IM SUSR
400.0000 mg | INTRAMUSCULAR | Status: DC
  Administered 2015-04-25: 400 mg via INTRAMUSCULAR

## 2015-04-24 MED ORDER — HYDROXYZINE HCL 50 MG PO TABS
50.0000 mg | ORAL_TABLET | Freq: Every evening | ORAL | Status: DC | PRN
  Administered 2015-04-26: 50 mg via ORAL
  Filled 2015-04-24: qty 1

## 2015-04-24 MED ORDER — TRAZODONE HCL 150 MG PO TABS
150.0000 mg | ORAL_TABLET | Freq: Every day | ORAL | Status: DC
  Administered 2015-04-24 – 2015-04-26 (×3): 150 mg via ORAL
  Filled 2015-04-24 (×4): qty 1
  Filled 2015-04-24: qty 7

## 2015-04-24 NOTE — Plan of Care (Signed)
Problem: Alteration in mood & ability to function due to Goal: LTG-Patient demonstrates decreased signs of withdrawal (Patient demonstrates decreased signs of withdrawal to the point the patient is safe to return home and continue treatment in an outpatient setting)  Outcome: Progressing He denies any withdrawal symptoms and CIWA's have been negative.
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
## 2015-04-24 NOTE — [facility] Group Notes (Signed)
[facility] LCSW Group Therapy  04/24/2015 , 1:13 PM   Type of Therapy:  Group Therapy  Participation Level:  Active  Participation Quality:  Attentive  Affect:  Appropriate  Cognitive:  Alert  Insight:  Improving  Engagement in Therapy:  Engaged  Modes of Intervention:  Discussion, Exploration and Socialization  Summary of Progress/Problems: Today's group focused on the term Diagnosis.  Participants were asked to define the term, and then pronounce whether it is a negative, positive or neutral term.  Stayed the entire time.  Spent mnost of the session with eyes closed.  When questioned directly, would need to have question repeated, but was then able to regroup and give an answer germane to the topic.  Daryel Geraldorth, Corrin Sieling B 04/24/2015 , 1:13 PM
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
## 2015-04-24 NOTE — [facility] Group Notes (Signed)
Adult Psychoeducational Group Note  Date:  04/24/2015 Time:  9:10 PM  Group Topic/Focus:  Wrap-Up Group:   The focus of this group is to help patients review their daily goal of treatment and discuss progress on daily workbooks.  Participation Level:  Active  Participation Quality:  Attentive  Affect:  Appropriate  Cognitive:  Appropriate  Insight: Good  Engagement in Group:  Engaged  Modes of Intervention:  Discussion  Additional Comments:  Pt. stated his day was good.  He also expressed it was the best day he has had in awhile.  His goal was to "get some napping in" because he didn't sleep well last night.  He also expressed he wanted to get to bed early and he is expecting to discharge on Thursday.  Caroll RancherLindsay, Yitta Gongaware A 04/24/2015, 9:10 PM

## 2015-04-24 NOTE — [facility] Group Notes (Signed)
[facility] Group Notes:  (Nursing/MHT/Case Management/Adjunct)  Date:  04/24/2015  Time:  10:40 AM  Type of Therapy:  Nurse Education  Participation Level:  Active  Participation Quality:  Appropriate, Drowsy and Sharing  Affect:  Flat  Cognitive:  Appropriate  Insight:  Appropriate  Engagement in Group:  Engaged and Supportive  Modes of Intervention:  Discussion and Education  Summary of Progress/Problems:  Group topic for the day is Recovery.  Discussed goal setting and learning new coping skills.  Peter Conrad reports that his goal for today is to be able to "Nap more."    Peter BaconHeather V Aleynah Conrad 04/24/2015, 10:40 AM

## 2015-04-25 NOTE — Plan of Care (Signed)
Problem: Alteration in thought process Goal: LTG-Patient has not harmed self or others in at least 2 days Outcome: Completed/Met Date Met:  04/25/15 Peter Conrad denies any suicidal thoughts over the last two days.
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
## 2015-04-25 NOTE — [facility] Group Notes (Signed)
[facility] LCSW Group Therapy  04/25/2015 3:03 PM  Type of Therapy: Group Therapy  Participation Level: Active  Participation Quality: Attentive  Affect: Flat  Cognitive: Oriented  Insight: Limited  Engagement in Therapy: Engaged  Modes of Intervention: Discussion and Socialization  Summary of Progress/Problems: Onalee HuaDavid from [employer] was here to tell his story of recovery and play his guitar. Pt was alert and pleasant in group today. Pt seemed to really enjoy the music at the end and even requested a couple songs. Vito BackersLynn B. Beverely PaceBryant 04/25/2015 3:03 PM

## 2015-04-25 NOTE — [facility] Group Notes (Signed)
 [facility]   04/25/2015 10:20 AM  Participation Quality:  Active   Mood/Affect:  Appropriate  Depression Rating:  denies  Anxiety Rating:  denies  Thoughts of Suicide:  No Will you contract for safety?   NA  Current AVH:  No  Plan for Discharge/Comments:  Pt was pleasant and alert in group today. Pt voiced some concerns about leaving the hospital stating "It will be hard moving back to the real world out of an environment where everything is catered and made safe  ". Pt also reported that he spoke to his wife last night and that it went well. Upon discharge [facility]  [employer] Means: Family will transport   Supports: Mental health providers and family  Jonathon JordanLynn B Bryant

## 2015-04-25 NOTE — Progress Notes (Addendum)
DAR Note: Peter Conrad has been in the day room much of the day.  He has been attending groups and interacting with peers.  He denies any SI/HI or A/V hallucinations.  He denies any pain and appears to be in no physical distress.  He states that he slept well last night.  "I only got up a few times to use the bathroom but I was able to go back to sleep."  He completed his self inventory and reports that his depression 2/10, hopelessness 0/10 and anxiety 0/10.  He reports that his goal for today is to "go to groups and hope to see the doctor."  Injection of Abilify maintena administered per order in his right hip.  He tolerated well.  Encouraged participation in group and unit activities.  Q 15 minute checks maintained for safety.  We will continue to monitor the progress towards his goals.

## 2015-04-25 NOTE — Progress Notes (Signed)
Adult Psychoeducational Group Note  Date:  04/25/2015 Time:  8:32 PM  Group Topic/Focus:  Wrap-Up Group:   The focus of this group is to help patients review their daily goal of treatment and discuss progress on daily workbooks.  Participation Level:  Active  Participation Quality:  Appropriate  Affect:  Appropriate  Cognitive:  Appropriate  Insight: Appropriate  Engagement in Group:  Engaged  Modes of Intervention:  Discussion  Additional Comments:The patient expressed that he attended group.The patient also said that his day was good.  [name], [name] 04/25/2015, 8:32 PM

## 2015-04-26 ENCOUNTER — Encounter (HOSPITAL_COMMUNITY): Payer: Self-pay | Admitting: Registered Nurse

## 2015-04-26 DIAGNOSIS — F39 Unspecified mood [affective] disorder: Secondary | ICD-10-CM | POA: Diagnosis present

## 2015-04-26 MED ORDER — ACETAMINOPHEN 325 MG PO TABS: 650.0000 mg | ORAL_TABLET | Freq: Four times a day (QID) | ORAL | Status: DC | PRN

## 2015-04-26 MED ORDER — ADULT MULTIVITAMIN W/MINERALS CH: 1.0000 | ORAL_TABLET | Freq: Every day | ORAL | Status: AC

## 2015-04-26 MED ORDER — HYDROXYZINE HCL 50 MG PO TABS: 100.0000 mg | ORAL_TABLET | Freq: Every evening | ORAL | Status: DC | PRN

## 2015-04-26 MED ORDER — NAPHAZOLINE-PHENIRAMINE 0.025-0.3 % OP SOLN: 1.0000 [drp] | Freq: Four times a day (QID) | OPHTHALMIC | Status: AC | PRN

## 2015-04-26 MED ORDER — TRAZODONE HCL 150 MG PO TABS: 150.0000 mg | ORAL_TABLET | Freq: Every day | ORAL | Status: AC

## 2015-04-26 MED ORDER — ARIPIPRAZOLE 15 MG PO TABS
25.0000 mg | ORAL_TABLET | Freq: Every day | ORAL | Status: DC
  Administered 2015-04-26: 25 mg via ORAL
  Filled 2015-04-26 (×2): qty 1

## 2015-04-26 MED ORDER — DIVALPROEX SODIUM ER 250 MG PO TB24: 750.0000 mg | ORAL_TABLET | Freq: Every day | ORAL | Status: AC

## 2015-04-26 MED ORDER — ARIPIPRAZOLE 5 MG PO TABS: 25.0000 mg | ORAL_TABLET | Freq: Every day | ORAL | Status: AC

## 2015-04-26 MED ORDER — BENZTROPINE MESYLATE 0.5 MG PO TABS: 0.5000 mg | ORAL_TABLET | Freq: Every day | ORAL | Status: AC

## 2015-04-26 MED ORDER — ARIPIPRAZOLE ER 400 MG IM SUSR: 400.0000 mg | INTRAMUSCULAR | Status: AC

## 2015-04-26 NOTE — Plan of Care (Signed)
Problem: Alteration in mood & ability to function due to Goal: LTG-Patient demonstrates decreased signs of withdrawal (Patient demonstrates decreased signs of withdrawal to the point the patient is safe to return home and continue treatment in an outpatient setting)  Outcome: Completed/Met Date Met:  04/26/15 Roben has not had any withdrawal symptoms in over 36 hours.  CIWA was D/C'd.
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
## 2015-04-26 NOTE — [facility] Group Notes (Signed)
[facility] LCSW Group Therapy  04/26/2015 3:30 PM  Type of Therapy: Process Group Therapy  Participation Level: Active  Participation Quality: Appropriate  Affect: Flat  Cognitive: Oriented  Insight: Limited  Engagement in Group: Engaged  Engagement in Therapy: Limited  Modes of Intervention: Activity, Clarification, Education, Problem-solving and Support  Summary of Progress/Problems: Today's group addressed the issue of overcoming obstacles. Patients were asked to identify their biggest obstacle post d/c that stands in the way of their on-going success, and then problem solve as to how to manage this. Pt was alert and pleasant in group today. Pt spoke about overcoming many obstacles in his life such as a divorce and family issues. When asked what helped him overcome these obstacles the pt stated, "My belief in God". Pt also offered helpful input to his peers when appropriate.   Peter Conrad, MSW Intern 04/26/2015 3:30 PM

## 2015-04-26 NOTE — Plan of Care (Signed)
Problem: Alteration in thought process Goal: STG-Patient is able to sleep at least 6 hours per night Outcome: Not Progressing Pt has had little sleep tonight.  He has woken up twice and he is currently refusing to try to get more sleep
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
## 2015-04-26 NOTE — Progress Notes (Signed)
DAR Note: Peter SpragueBeverly has been visible on the unit.  He attended groups.  He reports that he didn't sleep well last night because he isn't used to having a roommate.  "I was afraid that I would have been hurt."  He completed his self inventory and reports that his depression was 2/10, hopelessness 0/10 and anxiety 1/10.  Order obtained for no roommate due to increase in paranoia.  He denies SI/HI or A/V hallucinations.  He denies any paranoia or that people are trying to harm him.  He denies any pain.  He appears to be in no physical distress.  Encouraged continued participation in group and unit activities. We will continue to monitor the progress towards his goals.

## 2015-04-26 NOTE — Progress Notes (Signed)
Pt woke up again tonight when staff was doing Q15 minute checks.  When writer spoke with pt, pt was paranoid and delusional.  He was referring to MHT as "Renae Fickleaul" from the bible.  Pt was discussing how he has a job to do and how he is "bringing the second coming of Christ."  Pt asked Clinical research associatewriter "what if I am hallucinating?  They're just going to put me back in here."  He denies having hallucinations to writer, reporting that the just had a bad dream.  Pt was reassured that he is in a safe environment and he was encouraged to get some more rest.  Pt requested to speak with MHT and he is currently doing so.  He reports that he'll notify staff of needs and concerns.  Will continue to monitor and assess.
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
## 2015-04-26 NOTE — Progress Notes (Signed)
Patient ID: Peter FosterBeverly Conrad, male   DOB: July 23, 1950, [age] y.o.   MRN: [id number] D: Client visible on the unit, watching TV and interacting with peers. Client reports of his day "pretty good, took a lot of naps" Client reports admissions was helpful "I was about to lose it, had no sleep, that's been resolved" "I was so manic, they didn't know what to do with me" "I love my wife, I been married 25 years" Client has plans for discharge "go home do better, get that shot once a week" "try to hook up with one of those groups" A: Writer provided emotional support, reviewed medications, administered as ordered. Staff will monitor q4515min for safety. R: Client is safe on the unit, attended karaoke. Client participated "I sung Peter Conrad, let's stay together"

## 2015-04-26 NOTE — [facility] Group Notes (Signed)
[facility] Group Notes:  (Nursing/MHT/Case Management/Adjunct)  Date:  04/26/2015  Time:  10:26 AM  Type of Therapy:  Nurse Education  Participation Level:  Active  Participation Quality:  Attentive and Sharing  Affect:  Flat  Cognitive:  Disorganized  Insight:  Lacking  Engagement in Group:  Off Topic  Modes of Intervention:  Discussion and Education  Summary of Progress/Problems:  Group topic today was Leisure and Lifestyle changes.  Discussed the importance in having positive leisure activities to be successful after hospitalization.  Discussed setting appropriate goals.  Peter SpragueBeverly was awake during the group.  He was able to talk about lifestyle changes but would only focus on smoking cigarettes and not trying to discuss anything thing else.  He stated that he was hoping to go home today but will have to stay another day.    Norm ParcelHeather V Norris Bodley 04/26/2015, 10:26 AM

## 2015-04-27 MED ORDER — ARIPIPRAZOLE 10 MG PO TABS
25.0000 mg | ORAL_TABLET | Freq: Every day | ORAL | Status: DC
  Filled 2015-04-27: qty 18

## 2015-04-27 NOTE — Plan of Care (Signed)
Problem: Diagnosis: Increased Risk For Suicide Attempt Goal: STG-Patient Will Comply With Medication Regime Outcome: Progressing Client is compliant with medications regime AEB taking medications when schedule, not side effects noted or verbalized.

## 2015-04-27 NOTE — Progress Notes (Signed)
  [facility] :  Will you be returning to the same living situation after discharge:  Yes,  home At discharge, do you have transportation home?: Yes,  friend Do you have the ability to pay for your medications: Yes,  insurance  Release of information consent forms completed and in the chart;  Patient's signature needed at discharge.  Patient to Follow up at: Follow-up Information    [facility] Go on 05/15/2015.   Why:  Apt with Dr. Tyrell AntonioBetz @3 :45p for med management    Contact information:   [address] [place], [place] [id number] [phone]      Follow up with [facility]             .   Contact information:   [address] [place] [phone]       Patient denies SI/HI: Yes,  yes    Safety Planning and Suicide Prevention discussed: Yes,  yes  Have you used any form of tobacco in the last 30 days? (Cigarettes, Smokeless Tobacco, Cigars, and/or Pipes): Patient Refused Screening  Has patient been referred to the Quitline?: Patient refused referral  Ida Rogueorth, Nimai Burbach B 04/27/2015, 9:29 AM
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
## 2015-04-27 NOTE — Plan of Care (Signed)
Problem: Diagnosis: Increased Risk For Suicide Attempt Goal: LTG-Patient Will Report Improved Mood and Deny Suicidal LTG (by discharge) Patient will report improved mood and deny suicidal ideation.  Outcome: Progressing Client denies suicidal ideations "I don't want to hurt anybody, I wouldn't hurt anybody"

## 2015-04-27 NOTE — Progress Notes (Signed)
Patient verbalizes for discharge. Denies  SI/HI / is not psychotic or delusional . D/c instructions discussed with pt.. All belongings returned to pt who signed for same. R- Patient verbalize understanding of discharge instructions and sign for same.Marland Kitchen. A- Escorted to lobby

## 2015-04-27 NOTE — [facility] Suicide Risk Assessment (Signed)
[facility] INPATIENT:  Family/Significant Other Suicide Prevention Education  Suicide Prevention Education:  Education Completed; No one has been identified by the patient as the family member/significant other with whom the patient will be residing, and identified as the person(s) who will aid the patient in the event of a mental health crisis (suicidal ideations/suicide attempt).  With written consent from the patient, the family member/significant other has been provided the following suicide prevention education, prior to the and/or following the discharge of the patient.  The suicide prevention education provided includes the following:  Suicide risk factors  Suicide prevention and interventions  National Suicide Hotline telephone number  [facility] assessment telephone number  [facility] Emergency Assistance 911  [facility] and/or Residential Mobile Crisis Unit telephone number  Request made of family/significant other to:  Remove weapons (e.g., guns, rifles, knives), all items previously/currently identified as safety concern.    Remove drugs/medications (over-the-counter, prescriptions, illicit drugs), all items previously/currently identified as a safety concern.  The family member/significant other verbalizes understanding of the suicide prevention education information provided.  The family member/significant other agrees to remove the items of safety concern listed above.The patient did not endorse SI at the time of admission, nor did the patient c/o SI during the stay here.  SPE not required.  However, I did talk to his wife, Magnus Ivaneresa Newson, [phone], and we went over crises plan  Ida Rogueorth, Sarajean Dessert B 04/27/2015, 9:25 AM
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
